# Patient Record
Sex: Male | Born: 1965 | Race: White | Hispanic: No | Marital: Married
Health system: Southern US, Community
[De-identification: ages and names within clinical notes are randomized; demographics above are authoritative.]

---

## 2019-04-19 ENCOUNTER — Emergency Department (HOSPITAL_COMMUNITY): Payer: 59 | Admitting: Certified Registered Nurse Anesthetist

## 2019-04-19 ENCOUNTER — Ambulatory Visit (HOSPITAL_COMMUNITY)
Admission: EM | Admit: 2019-04-19 | Discharge: 2019-04-19 | Disposition: A | Discharge status: Home / Self Care | Payer: 59 | Attending: Emergency Medicine | Admitting: Emergency Medicine

## 2019-04-19 ENCOUNTER — Emergency Department (HOSPITAL_COMMUNITY): Payer: 59

## 2019-04-19 ENCOUNTER — Encounter (HOSPITAL_COMMUNITY)
Admission: EM | Disposition: A | Discharge status: Home / Self Care | Payer: Self-pay | Source: Home / Self Care | Attending: Emergency Medicine

## 2019-04-19 ENCOUNTER — Encounter (HOSPITAL_COMMUNITY): Payer: Self-pay

## 2019-04-19 ENCOUNTER — Other Ambulatory Visit: Payer: Self-pay

## 2019-04-19 DIAGNOSIS — S66329A Laceration of extensor muscle, fascia and tendon of unspecified finger at wrist and hand level, initial encounter: Secondary | ICD-10-CM | POA: Diagnosis not present

## 2019-04-19 DIAGNOSIS — F17290 Nicotine dependence, other tobacco product, uncomplicated: Secondary | ICD-10-CM | POA: Insufficient documentation

## 2019-04-19 DIAGNOSIS — Z1159 Encounter for screening for other viral diseases: Secondary | ICD-10-CM | POA: Diagnosis not present

## 2019-04-19 DIAGNOSIS — S51812A Laceration without foreign body of left forearm, initial encounter: Secondary | ICD-10-CM | POA: Diagnosis present

## 2019-04-19 DIAGNOSIS — S56522A Laceration of other extensor muscle, fascia and tendon at forearm level, left arm, initial encounter: Secondary | ICD-10-CM | POA: Insufficient documentation

## 2019-04-19 DIAGNOSIS — S56922A Laceration of unspecified muscles, fascia and tendons at forearm level, left arm, initial encounter: Secondary | ICD-10-CM | POA: Diagnosis present

## 2019-04-19 HISTORY — PX: I & D EXTREMITY: SHX5045

## 2019-04-19 LAB — CBC
HCT: 40.1 % (ref 39.0–52.0)
Hemoglobin: 13.6 g/dL (ref 13.0–17.0)
MCH: 31.7 pg (ref 26.0–34.0)
MCHC: 33.9 g/dL (ref 30.0–36.0)
MCV: 93.5 fL (ref 80.0–100.0)
Platelets: 316 10*3/uL (ref 150–400)
RBC: 4.29 MIL/uL (ref 4.22–5.81)
RDW: 13.2 % (ref 11.5–15.5)
WBC: 10.7 10*3/uL — ABNORMAL HIGH (ref 4.0–10.5)
nRBC: 0 % (ref 0.0–0.2)

## 2019-04-19 LAB — COMPREHENSIVE METABOLIC PANEL
ALT: 18 U/L (ref 0–44)
AST: 18 U/L (ref 15–41)
Albumin: 3.7 g/dL (ref 3.5–5.0)
Alkaline Phosphatase: 81 U/L (ref 38–126)
Anion gap: 11 (ref 5–15)
BUN: 9 mg/dL (ref 6–20)
CO2: 26 mmol/L (ref 22–32)
Calcium: 9.3 mg/dL (ref 8.9–10.3)
Chloride: 101 mmol/L (ref 98–111)
Creatinine, Ser: 0.94 mg/dL (ref 0.61–1.24)
GFR calc Af Amer: >60 mL/min (ref >60)
GFR calc non Af Amer: >60 mL/min (ref >60)
Glucose, Bld: 106 mg/dL — ABNORMAL HIGH (ref 70–99)
Potassium: 4.3 mmol/L (ref 3.5–5.1)
Sodium: 138 mmol/L (ref 135–145)
Total Bilirubin: 0.6 mg/dL (ref 0.3–1.2)
Total Protein: 7.1 g/dL (ref 6.5–8.1)

## 2019-04-19 LAB — SARS CORONAVIRUS 2 BY RT PCR (HOSPITAL ORDER, PERFORMED IN ~~LOC~~ HOSPITAL LAB): SARS Coronavirus 2: NEGATIVE

## 2019-04-19 SURGERY — IRRIGATION AND DEBRIDEMENT EXTREMITY
Anesthesia: General | Site: Arm Lower | Laterality: Left

## 2019-04-19 MED ORDER — SUCCINYLCHOLINE CHLORIDE 200 MG/10ML IV SOSY
PREFILLED_SYRINGE | INTRAVENOUS | Status: AC
  Filled 2019-04-19: qty 10

## 2019-04-19 MED ORDER — ACETAMINOPHEN 500 MG PO TABS: 1000.0000 mg | ORAL_TABLET | Freq: Three times a day (TID) | ORAL | 0 refills | Status: AC

## 2019-04-19 MED ORDER — METHOCARBAMOL 750 MG PO TABS: 750.0000 mg | ORAL_TABLET | Freq: Three times a day (TID) | ORAL | 0 refills | Status: AC | PRN

## 2019-04-19 MED ORDER — FENTANYL CITRATE (PF) 100 MCG/2ML IJ SOLN
25.0000 ug | INTRAMUSCULAR | Status: DC | PRN
  Administered 2019-04-19 (×2): 50 ug via INTRAVENOUS

## 2019-04-19 MED ORDER — CELECOXIB 200 MG PO CAPS: 200.0000 mg | ORAL_CAPSULE | Freq: Two times a day (BID) | ORAL | 0 refills | Status: AC

## 2019-04-19 MED ORDER — SUCCINYLCHOLINE CHLORIDE 20 MG/ML IJ SOLN
INTRAMUSCULAR | Status: DC | PRN
  Administered 2019-04-19: 110 mg via INTRAVENOUS

## 2019-04-19 MED ORDER — OXYCODONE HCL 5 MG/5ML PO SOLN: 5.0000 mg | Freq: Once | ORAL | Status: AC | PRN

## 2019-04-19 MED ORDER — MIDAZOLAM HCL 2 MG/2ML IJ SOLN
INTRAMUSCULAR | Status: AC
  Filled 2019-04-19: qty 2

## 2019-04-19 MED ORDER — HYDROMORPHONE HCL 1 MG/ML IJ SOLN
1.0000 mg | Freq: Once | INTRAMUSCULAR | Status: AC
  Administered 2019-04-19: 1 mg via INTRAVENOUS
  Filled 2019-04-19: qty 1

## 2019-04-19 MED ORDER — DEXAMETHASONE SODIUM PHOSPHATE 10 MG/ML IJ SOLN
INTRAMUSCULAR | Status: DC | PRN
  Administered 2019-04-19: 10 mg via INTRAVENOUS

## 2019-04-19 MED ORDER — FENTANYL CITRATE (PF) 250 MCG/5ML IJ SOLN
INTRAMUSCULAR | Status: AC
  Filled 2019-04-19: qty 5

## 2019-04-19 MED ORDER — LACTATED RINGERS IV SOLN
INTRAVENOUS | Status: DC | PRN
  Administered 2019-04-19: 20:00:00 via INTRAVENOUS

## 2019-04-19 MED ORDER — DEXAMETHASONE SODIUM PHOSPHATE 10 MG/ML IJ SOLN
INTRAMUSCULAR | Status: AC
  Filled 2019-04-19: qty 1

## 2019-04-19 MED ORDER — BUPIVACAINE HCL (PF) 0.25 % IJ SOLN
INTRAMUSCULAR | Status: AC
  Filled 2019-04-19: qty 30

## 2019-04-19 MED ORDER — CEFAZOLIN SODIUM-DEXTROSE 2-4 GM/100ML-% IV SOLN
INTRAVENOUS | Status: AC
  Filled 2019-04-19: qty 100

## 2019-04-19 MED ORDER — MIDAZOLAM HCL 5 MG/5ML IJ SOLN
INTRAMUSCULAR | Status: DC | PRN
  Administered 2019-04-19 (×2): 1 mg via INTRAVENOUS

## 2019-04-19 MED ORDER — PROPOFOL 10 MG/ML IV BOLUS
INTRAVENOUS | Status: AC
  Filled 2019-04-19: qty 20

## 2019-04-19 MED ORDER — OXYCODONE HCL 5 MG PO TABS
5.0000 mg | ORAL_TABLET | Freq: Once | ORAL | Status: AC | PRN
  Administered 2019-04-19: 5 mg via ORAL

## 2019-04-19 MED ORDER — LIDOCAINE 2% (20 MG/ML) 5 ML SYRINGE
INTRAMUSCULAR | Status: AC
  Filled 2019-04-19: qty 5

## 2019-04-19 MED ORDER — GABAPENTIN 300 MG PO CAPS: 300.0000 mg | ORAL_CAPSULE | Freq: Two times a day (BID) | ORAL | 0 refills | Status: AC

## 2019-04-19 MED ORDER — FENTANYL CITRATE (PF) 100 MCG/2ML IJ SOLN
INTRAMUSCULAR | Status: DC | PRN
  Administered 2019-04-19: 50 ug via INTRAVENOUS
  Administered 2019-04-19: 25 ug via INTRAVENOUS
  Administered 2019-04-19: 50 ug via INTRAVENOUS
  Administered 2019-04-19: 25 ug via INTRAVENOUS

## 2019-04-19 MED ORDER — OXYCODONE HCL 5 MG PO TABS
ORAL_TABLET | ORAL | Status: AC
  Filled 2019-04-19: qty 1

## 2019-04-19 MED ORDER — CEFAZOLIN SODIUM-DEXTROSE 2-3 GM-%(50ML) IV SOLR
INTRAVENOUS | Status: DC | PRN
  Administered 2019-04-19: 2 g via INTRAVENOUS

## 2019-04-19 MED ORDER — ONDANSETRON HCL 4 MG/2ML IJ SOLN
INTRAMUSCULAR | Status: AC
  Filled 2019-04-19: qty 2

## 2019-04-19 MED ORDER — 0.9 % SODIUM CHLORIDE (POUR BTL) OPTIME
TOPICAL | Status: DC | PRN
  Administered 2019-04-19: 1000 mL

## 2019-04-19 MED ORDER — PROMETHAZINE HCL 25 MG/ML IJ SOLN: 6.2500 mg | INTRAMUSCULAR | Status: DC | PRN

## 2019-04-19 MED ORDER — TETANUS-DIPHTH-ACELL PERTUSSIS 5-2.5-18.5 LF-MCG/0.5 IM SUSP
0.5000 mL | Freq: Once | INTRAMUSCULAR | Status: AC
  Administered 2019-04-19: 0.5 mL via INTRAMUSCULAR
  Filled 2019-04-19: qty 0.5

## 2019-04-19 MED ORDER — LIDOCAINE 2% (20 MG/ML) 5 ML SYRINGE
INTRAMUSCULAR | Status: DC | PRN
  Administered 2019-04-19: 60 mg via INTRAVENOUS

## 2019-04-19 MED ORDER — PROPOFOL 10 MG/ML IV BOLUS
INTRAVENOUS | Status: DC | PRN
  Administered 2019-04-19: 180 mg via INTRAVENOUS

## 2019-04-19 MED ORDER — FENTANYL CITRATE (PF) 100 MCG/2ML IJ SOLN
INTRAMUSCULAR | Status: AC
  Administered 2019-04-19: 50 ug via INTRAVENOUS
  Filled 2019-04-19: qty 2

## 2019-04-19 MED ORDER — ONDANSETRON HCL 4 MG PO TABS: 4.0000 mg | ORAL_TABLET | Freq: Three times a day (TID) | ORAL | 0 refills | Status: AC | PRN

## 2019-04-19 MED ORDER — ONDANSETRON HCL 4 MG/2ML IJ SOLN
INTRAMUSCULAR | Status: DC | PRN
  Administered 2019-04-19: 4 mg via INTRAVENOUS

## 2019-04-19 MED ORDER — SODIUM CHLORIDE 0.9 % IR SOLN
Status: DC | PRN
  Administered 2019-04-19: 3000 mL

## 2019-04-19 MED ORDER — BUPIVACAINE HCL 0.25 % IJ SOLN
INTRAMUSCULAR | Status: DC | PRN
  Administered 2019-04-19: 10 mL

## 2019-04-19 MED ORDER — OXYCODONE HCL 5 MG PO TABS: 5.0000 mg | ORAL_TABLET | ORAL | 0 refills | Status: AC | PRN

## 2019-04-19 SURGICAL SUPPLY — 66 items
BANDAGE ACE 4X5 VEL STRL LF (GAUZE/BANDAGES/DRESSINGS) ×3 IMPLANT
BANDAGE ACE 6X5 VEL STRL LF (GAUZE/BANDAGES/DRESSINGS) ×3 IMPLANT
BANDAGE ELASTIC 4 VELCRO ST LF (GAUZE/BANDAGES/DRESSINGS) ×6 IMPLANT
BANDAGE ESMARK 6X9 LF (GAUZE/BANDAGES/DRESSINGS) IMPLANT
BLADE SURG 10 STRL SS (BLADE) ×3 IMPLANT
BNDG COHESIVE 4X5 TAN STRL (GAUZE/BANDAGES/DRESSINGS) ×6 IMPLANT
BNDG ESMARK 4X9 LF (GAUZE/BANDAGES/DRESSINGS) IMPLANT
BNDG ESMARK 6X9 LF (GAUZE/BANDAGES/DRESSINGS)
BNDG GAUZE ELAST 4 BULKY (GAUZE/BANDAGES/DRESSINGS) ×3 IMPLANT
CONT SPEC 4OZ CLIKSEAL STRL BL (MISCELLANEOUS) IMPLANT
COVER SURGICAL LIGHT HANDLE (MISCELLANEOUS) ×3 IMPLANT
COVER WAND RF STERILE (DRAPES) ×3 IMPLANT
CUFF TOURN SGL LL 12 NO SLV (MISCELLANEOUS) IMPLANT
CUFF TOURNIQUET SINGLE 34IN LL (TOURNIQUET CUFF) IMPLANT
DRAPE SURG 17X23 STRL (DRAPES) IMPLANT
DRAPE U-SHAPE 47X51 STRL (DRAPES) IMPLANT
DRSG ADAPTIC 3X8 NADH LF (GAUZE/BANDAGES/DRESSINGS) ×3 IMPLANT
DRSG PAD ABDOMINAL 8X10 ST (GAUZE/BANDAGES/DRESSINGS) ×6 IMPLANT
DURAPREP 26ML APPLICATOR (WOUND CARE) ×3 IMPLANT
ELECT REM PT RETURN 9FT ADLT (ELECTROSURGICAL)
ELECTRODE REM PT RTRN 9FT ADLT (ELECTROSURGICAL) IMPLANT
EVACUATOR 1/8 PVC DRAIN (DRAIN) IMPLANT
FACESHIELD WRAPAROUND (MASK) ×3 IMPLANT
GAUZE SPONGE 4X4 12PLY STRL (GAUZE/BANDAGES/DRESSINGS) ×6 IMPLANT
GAUZE XEROFORM 1X8 LF (GAUZE/BANDAGES/DRESSINGS) ×3 IMPLANT
GLOVE BIO SURGEON STRL SZ7.5 (GLOVE) ×6 IMPLANT
GLOVE BIOGEL PI IND STRL 6.5 (GLOVE) ×1 IMPLANT
GLOVE BIOGEL PI IND STRL 7.0 (GLOVE) ×1 IMPLANT
GLOVE BIOGEL PI IND STRL 8 (GLOVE) ×2 IMPLANT
GLOVE BIOGEL PI INDICATOR 6.5 (GLOVE) ×2
GLOVE BIOGEL PI INDICATOR 7.0 (GLOVE) ×2
GLOVE BIOGEL PI INDICATOR 8 (GLOVE) ×4
GLOVE SURG SS PI 7.0 STRL IVOR (GLOVE) ×3 IMPLANT
GOWN STRL REUS W/ TWL LRG LVL3 (GOWN DISPOSABLE) ×3 IMPLANT
GOWN STRL REUS W/TWL LRG LVL3 (GOWN DISPOSABLE) ×6
HANDPIECE INTERPULSE COAX TIP (DISPOSABLE)
KIT BASIN OR (CUSTOM PROCEDURE TRAY) ×3 IMPLANT
KIT TURNOVER KIT B (KITS) ×3 IMPLANT
MANIFOLD NEPTUNE II (INSTRUMENTS) ×3 IMPLANT
NEEDLE 25GAX1.5 (MISCELLANEOUS) ×3 IMPLANT
NS IRRIG 1000ML POUR BTL (IV SOLUTION) ×3 IMPLANT
PACK ORTHO EXTREMITY (CUSTOM PROCEDURE TRAY) ×3 IMPLANT
PAD ABD 8X10 STRL (GAUZE/BANDAGES/DRESSINGS) ×6 IMPLANT
PAD ARMBOARD 7.5X6 YLW CONV (MISCELLANEOUS) ×6 IMPLANT
PAD CAST 4YDX4 CTTN HI CHSV (CAST SUPPLIES) ×1 IMPLANT
PADDING CAST COTTON 4X4 STRL (CAST SUPPLIES) ×2
SET CYSTO W/LG BORE CLAMP LF (SET/KITS/TRAYS/PACK) ×3 IMPLANT
SET HNDPC FAN SPRY TIP SCT (DISPOSABLE) IMPLANT
SLING ARM FOAM STRAP LRG (SOFTGOODS) ×3 IMPLANT
SPLINT PLASTER CAST XFAST 5X30 (CAST SUPPLIES) ×10 IMPLANT
SPLINT PLASTER XFAST SET 5X30 (CAST SUPPLIES) ×20
SPONGE LAP 18X18 RF (DISPOSABLE) IMPLANT
STOCKINETTE IMPERVIOUS 9X36 MD (GAUZE/BANDAGES/DRESSINGS) ×3 IMPLANT
SUT ETHILON 3 0 PS 1 (SUTURE) ×12 IMPLANT
SUT PDS AB 2-0 CT1 27 (SUTURE) IMPLANT
SUT VIC AB 2-0 CT1 27 (SUTURE) ×2
SUT VIC AB 2-0 CT1 TAPERPNT 27 (SUTURE) ×1 IMPLANT
SWAB CULTURE ESWAB REG 1ML (MISCELLANEOUS) IMPLANT
SYR CONTROL 10ML LL (SYRINGE) ×3 IMPLANT
TOWEL OR 17X24 6PK STRL BLUE (TOWEL DISPOSABLE) ×3 IMPLANT
TOWEL OR 17X26 10 PK STRL BLUE (TOWEL DISPOSABLE) ×3 IMPLANT
TUBE CONNECTING 12'X1/4 (SUCTIONS) ×1
TUBE CONNECTING 12X1/4 (SUCTIONS) ×2 IMPLANT
TUBING CYSTO DISP (UROLOGICAL SUPPLIES) IMPLANT
UNDERPAD 30X30 (UNDERPADS AND DIAPERS) ×3 IMPLANT
YANKAUER SUCT BULB TIP NO VENT (SUCTIONS) ×3 IMPLANT

## 2019-04-19 NOTE — Anesthesia Procedure Notes (Signed)
Procedure Name: Intubation Date/Time: 04/19/2019 7:46 PM Performed by: Beryle Lathe, MD Pre-anesthesia Checklist: Patient identified, Emergency Drugs available, Suction available and Patient being monitored Patient Re-evaluated:Patient Re-evaluated prior to induction Oxygen Delivery Method: Circle system utilized Preoxygenation: Pre-oxygenation with 100% oxygen Induction Type: IV induction Laryngoscope Size: Miller and 2 Grade View: Grade I Tube type: Oral Tube size: 7.5 mm Number of attempts: 2 (First attempt by CRNA with Mil 3, grade 2B view. 2nd attempt by Dr. Mal Amabile with Mil 2, grade 1 view.) Airway Equipment and Method: Stylet and Oral airway Placement Confirmation: ETT inserted through vocal cords under direct vision,  positive ETCO2 and breath sounds checked- equal and bilateral Secured at: 23 cm Tube secured with: Tape Dental Injury: Teeth and Oropharynx as per pre-operative assessment

## 2019-04-19 NOTE — Anesthesia Procedure Notes (Deleted)
Procedure Name: Intubation Date/Time: 04/19/2019 7:47 PM Performed by: Edmonia Caprio, CRNA Pre-anesthesia Checklist: Patient identified, Suction available, Emergency Drugs available, Patient being monitored and Timeout performed Patient Re-evaluated:Patient Re-evaluated prior to induction Oxygen Delivery Method: Circle system utilized Preoxygenation: Pre-oxygenation with 100% oxygen Induction Type: IV induction and Rapid sequence Ventilation: Mask ventilation without difficulty Laryngoscope Size: Miller, 3 and 2 Grade View: Grade II Tube type: Oral Tube size: 7.5 mm Number of attempts: 2 Airway Equipment and Method: Stylet Placement Confirmation: ETT inserted through vocal cords under direct vision,  positive ETCO2 and breath sounds checked- equal and bilateral Secured at: 23 cm Tube secured with: Tape Dental Injury: Teeth and Oropharynx as per pre-operative assessment  Comments: DL x1 with Mil 3 by CRNA. Arytenoids seen. DL by Mal Amabile, cords visualized and ETT passed through successfully. +BBS +ETC02.  02 sats stable throughout procedure.

## 2019-04-19 NOTE — Discharge Instructions (Signed)
Elevate your arm above your heart at all times to reduce swelling and pain.     Diet: As you were doing prior to hospitalization   Dressing:  Keep splint and dressings on and dry until follow up.  Activity:  Increase activity slowly as tolerated, but follow the weight bearing instructions below.  The rules on driving is that you can not be taking narcotics while you drive, and you must feel in control of the vehicle.    Weight Bearing:  Non weight bearing left arm.  To prevent constipation: you may use a stool softener such as -  Colace (over the counter) 100 mg by mouth twice a day  Drink plenty of fluids (prune juice may be helpful) and high fiber foods Miralax (over the counter) for constipation as needed.    Itching:  If you experience itching with your medications, try taking only a single pain pill, or even half a pain pill at a time.  You can also use benadryl over the counter for itching or also to help with sleep.   Precautions:  If you experience chest pain or shortness of breath - call 911 immediately for transfer to the hospital emergency department!!  If you develop a fever greater that 101 F, purulent drainage from wound, increased redness or drainage from wound, or calf pain -- Call the office at [phone]                                                 Follow- Up Appointment:  Please call for an appointment to be seen on 04/22/19 Wednesday morning at 830 AM [place] - [phone]
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
## 2019-04-19 NOTE — Transfer of Care (Signed)
Immediate Anesthesia Transfer of Care Note  Patient: Nathan Arias  Procedure(s) Performed: REPAIR LEFT FOREARM LACERATION (Left Arm Lower)  Patient Location: PACU  Anesthesia Type:General  Level of Consciousness: awake, alert  and oriented  Airway & Oxygen Therapy: Patient Spontanous Breathing and Patient connected to face mask oxygen  Post-op Assessment: Report given to RN, Post -op Vital signs reviewed and stable and Patient moving all extremities X 4  Post vital signs: Reviewed and stable  Last Vitals:  Vitals Value Taken Time  BP 161/99 04/19/2019  8:56 PM  Temp    Pulse 98 04/19/2019  8:57 PM  Resp 26 04/19/2019  8:57 PM  SpO2 94 % 04/19/2019  8:57 PM  Vitals shown include unvalidated device data.  Last Pain:  Vitals:   04/19/19 1710  TempSrc:   PainSc: 10-Worst pain ever         Complications: No apparent anesthesia complications

## 2019-04-19 NOTE — Anesthesia Postprocedure Evaluation (Signed)
Anesthesia Post Note  Patient: Nathan Arias  Procedure(s) Performed: REPAIR LEFT FOREARM LACERATION (Left Arm Lower)     Patient location during evaluation: PACU Anesthesia Type: General Level of consciousness: awake and alert Pain management: pain level controlled Vital Signs Assessment: post-procedure vital signs reviewed and stable Respiratory status: spontaneous breathing, nonlabored ventilation and respiratory function stable Cardiovascular status: blood pressure returned to baseline and stable Postop Assessment: no apparent nausea or vomiting Anesthetic complications: no    Last Vitals:  Vitals:   04/19/19 2132 04/19/19 2133  BP:  (!) 148/90  Pulse: 75 79  Resp: 13 10  Temp: 36.5 C   SpO2: 96% 93%    Last Pain:  Vitals:   04/19/19 2122  TempSrc:   PainSc: 3                  Beryle Lathe
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
## 2019-04-19 NOTE — ED Notes (Signed)
Saline soaked gauze to open wound areas and splint reapplied by MD.

## 2019-04-19 NOTE — ED Notes (Signed)
Pt transported to short stay 36.  Pt does have valuables w/ security.  COVID test collected and dropped off at lab by this Clinical research associate.
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
## 2019-04-20 ENCOUNTER — Encounter (HOSPITAL_COMMUNITY): Payer: Self-pay | Admitting: Orthopedic Surgery
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
